# Patient Record
Sex: Female | Born: 1960 | Race: White | Hispanic: No | Marital: Single
Health system: Midwestern US, Community
[De-identification: ages and names within clinical notes are randomized; demographics above are authoritative.]

## PROBLEM LIST (undated history)

## (undated) DIAGNOSIS — Z1231 Encounter for screening mammogram for malignant neoplasm of breast: Secondary | ICD-10-CM

## (undated) DIAGNOSIS — R920 Mammographic microcalcification found on diagnostic imaging of breast: Secondary | ICD-10-CM

## (undated) DIAGNOSIS — Z01811 Encounter for preprocedural respiratory examination: Secondary | ICD-10-CM

---

## 2014-07-21 LAB — COMPREHENSIVE METABOLIC PANEL
ALT: 15 U/L (ref 0–32)
AST: 20 U/L (ref 0–31)
Albumin: 4.6 g/dL (ref 3.5–5.2)
Alkaline Phosphatase: 77 U/L (ref 35–104)
Anion Gap: 14 mmol/L (ref 7–16)
BUN: 8 mg/dL (ref 6–20)
CO2: 27 mmol/L (ref 22–29)
Calcium: 9.7 mg/dL (ref 8.6–10.2)
Chloride: 103 mmol/L (ref 98–107)
Creatinine: 0.6 mg/dL (ref 0.5–1.0)
GFR African American: >60
GFR Non-African American: >60 mL/min/{1.73_m2} (ref >=60)
Glucose: 101 mg/dL (ref 74–109)
Potassium: 4.7 mmol/L (ref 3.5–5.0)
Sodium: 144 mmol/L (ref 132–146)
Total Bilirubin: 0.4 mg/dL (ref 0.0–1.2)
Total Protein: 7.4 g/dL (ref 6.4–8.3)

## 2014-07-21 LAB — CBC
Hematocrit: 42.8 % (ref 34.0–48.0)
Hemoglobin: 13.8 g/dL (ref 11.5–15.5)
MCH: 30.6 pg (ref 26.0–35.0)
MCHC: 32.3 % (ref 32.0–34.5)
MCV: 94.8 fL (ref 80.0–99.9)
MPV: 8.7 fL (ref 7.0–12.0)
Platelets: 261 E9/L (ref 130–450)
RBC: 4.52 E12/L (ref 3.50–5.50)
RDW: 13.6 fL (ref 11.5–15.0)
WBC: 9.9 E9/L (ref 4.5–11.5)

## 2014-07-21 LAB — LIPID PANEL
Cholesterol, Total: 178 mg/dL (ref 0–199)
HDL: 67 mg/dL (ref >40)
LDL Calculated: 94 mg/dL (ref 0–99)
Triglycerides: 84 mg/dL (ref 0–149)
VLDL Cholesterol Calculated: 17 mg/dL

## 2014-07-21 LAB — C-REACTIVE PROTEIN: CRP: 0.2 mg/dL (ref 0.0–0.4)

## 2014-07-21 LAB — TSH: TSH: 1.19 u[IU]/mL (ref 0.270–4.200)

## 2014-07-21 LAB — RHEUMATOID FACTOR: Rheumatoid Factor: <10 IU/mL (ref 0–13)

## 2014-07-21 LAB — ANA: ANA: NEGATIVE

## 2014-07-21 LAB — SEDIMENTATION RATE: Sed Rate: 3 mm/Hr (ref 0–20)

## 2015-02-18 ENCOUNTER — Encounter

## 2015-02-18 ENCOUNTER — Inpatient Hospital Stay: Primary: Family Medicine

## 2015-02-18 ENCOUNTER — Ambulatory Visit: Admit: 2015-02-18 | Primary: Family Medicine

## 2015-02-18 DIAGNOSIS — R920 Mammographic microcalcification found on diagnostic imaging of breast: Secondary | ICD-10-CM

## 2015-02-18 LAB — PROTIME-INR
INR: 1.1
Protime: 11.3 s (ref 9.3–12.4)

## 2019-11-30 ENCOUNTER — Ambulatory Visit
Admit: 2019-11-30 | Discharge: 2019-11-30 | Payer: BLUE CROSS/BLUE SHIELD | Attending: Family Medicine | Primary: Family Medicine

## 2019-11-30 DIAGNOSIS — K629 Disease of anus and rectum, unspecified: Secondary | ICD-10-CM

## 2019-11-30 MED ORDER — PREDNISONE 5 MG (21) PO TBPK
5 MG (21) | ORAL | 0 refills | Status: DC
Start: 2019-11-30 — End: 2020-02-15

## 2019-12-24 NOTE — Telephone Encounter (Signed)
11/30/2019  Visit date not found    Tresa Endo from [employer] in [place] called for prior auth on CT AB/Pelvis.      This was never ordered by our office.  Rave was informed that we would not be obtaining the auth.  Nor are we going to order the imaging.

## 2020-01-08 ENCOUNTER — Encounter

## 2020-01-11 ENCOUNTER — Ambulatory Visit: Admit: 2020-01-11 | Discharge: 2020-01-11 | Payer: BLUE CROSS/BLUE SHIELD | Primary: Family Medicine

## 2020-01-11 ENCOUNTER — Encounter
Admit: 2020-01-11 | Discharge: 2020-01-11 | Payer: BLUE CROSS/BLUE SHIELD | Attending: Family Medicine | Primary: Family Medicine

## 2020-01-11 DIAGNOSIS — Z01811 Encounter for preprocedural respiratory examination: Secondary | ICD-10-CM

## 2020-01-11 DIAGNOSIS — Z01818 Encounter for other preprocedural examination: Secondary | ICD-10-CM

## 2020-01-11 NOTE — Telephone Encounter (Signed)
Attempted to call  No answer  Left detailed message

## 2020-01-11 NOTE — Telephone Encounter (Signed)
[facility] called back, advised of results.   Sending letter of clearance with ekg and cxr to dr. Olen Cordial

## 2020-01-11 NOTE — Telephone Encounter (Signed)
Lisa Nunez came in today for her pre op EKG  Can you please review? She wants to know the results of it today.  Is she cleared for surgery based off of the EKG?

## 2020-01-18 ENCOUNTER — Encounter: Attending: Family Medicine | Primary: Family Medicine

## 2020-02-15 ENCOUNTER — Ambulatory Visit: Admit: 2020-02-15 | Discharge: 2020-02-15 | Payer: BLUE CROSS/BLUE SHIELD | Attending: Family | Primary: Family Medicine

## 2020-02-15 DIAGNOSIS — R42 Dizziness and giddiness: Secondary | ICD-10-CM

## 2020-02-15 MED ORDER — MECLIZINE HCL 25 MG PO TABS
25 MG | ORAL_TABLET | Freq: Three times a day (TID) | ORAL | 0 refills | Status: AC | PRN
Start: 2020-02-15 — End: 2020-02-25

## 2020-02-15 MED ORDER — NEOMYCIN-POLYMYXIN-HC 3.5-10000-1 OT SOLN
Freq: Three times a day (TID) | OTIC | 0 refills | Status: AC
Start: 2020-02-15 — End: 2020-02-25

## 2020-02-15 NOTE — Progress Notes (Signed)
Lisa Nunez (DOB:  04-22-1961) is a 59 y.o. female,Established patient, here for evaluation of the following chief complaint(s):  Otalgia (rt ear - hx of ear infections and impaction ) and Dizziness         ASSESSMENT/PLAN:  1. Vertigo  -     meclizine (ANTIVERT) 25 MG tablet; Take 1 tablet by mouth 3 times daily as needed for Dizziness, Disp-30 tablet, R-0Normal  -epley manuever  2. Acute otitis externa of right ear, unspecified type  -     neomycin-polymyxin-hydrocortisone (CORTISPORIN) 3.5-10000-1 otic solution; Place 4 drops into the right ear 3 times daily for 10 days Instill into right Ear, Disp-10 mL, R-0Normal    Contact office if symptoms do not improve as expected or worsen.    Return if symptoms worsen or fail to improve.         Subjective   SUBJECTIVE/OBJECTIVE:  Complains of history of right ear infections and impactions.  Also history of vertigo. States ear is painful and she has been dizziness for past 2 days.  Taking mucinex and zyrtec      Review of Systems   Constitutional: Negative for chills, diaphoresis and fever.   HENT: Positive for congestion and ear pain. Negative for sore throat.    Respiratory: Negative for cough, shortness of breath and wheezing.    Cardiovascular: Negative for chest pain and palpitations.   Gastrointestinal: Positive for nausea (with vertigo). Negative for constipation, diarrhea and vomiting.   Allergic/Immunologic: Positive for environmental allergies.   Neurological: Positive for dizziness. Negative for weakness and headaches.          Objective   BP 130/78    Pulse 112    Temp 96.8 ??F (36 ??C) (Temporal)    Resp 14    Ht 5\' 5"  (1.651 m)    Wt 104 lb (47.2 kg)    SpO2 96%    BMI 17.31 kg/m??    Physical Exam  Constitutional:       General: She is not in acute distress.     Appearance: Normal appearance. She is well-developed.   HENT:      Head: Normocephalic and atraumatic.      Right Ear: There is no impacted cerumen.      Left Ear: Tympanic membrane, ear canal and  external ear normal. There is no impacted cerumen.      Ears:      Comments: Right TM is dull, canal is erythematous  Eyes:      Extraocular Movements: Extraocular movements intact.      Pupils: Pupils are equal, round, and reactive to light.   Neck:      Thyroid: No thyromegaly.      Vascular: No carotid bruit.      Trachea: No tracheal deviation.   Cardiovascular:      Rate and Rhythm: Normal rate and regular rhythm.      Heart sounds: No murmur heard.     Pulmonary:      Effort: Pulmonary effort is normal.      Breath sounds: Normal breath sounds. No wheezing or rales.   Chest:      Chest wall: No tenderness.   Lymphadenopathy:      Cervical: No cervical adenopathy.   Skin:     General: Skin is warm and dry.   Neurological:      Mental Status: She is alert and oriented to person, place, and time.      Cranial Nerves: No cranial nerve  deficit.   Psychiatric:         Mood and Affect: Mood normal.         Behavior: Behavior normal.            MDM low      An electronic signature was used to authenticate this note.    --Orlie Dakin, APRN - CNP

## 2021-04-11 ENCOUNTER — Encounter

## 2021-04-11 ENCOUNTER — Inpatient Hospital Stay: Admit: 2021-04-11 | Payer: BLUE CROSS/BLUE SHIELD | Primary: Family Medicine

## 2021-04-11 DIAGNOSIS — Z1231 Encounter for screening mammogram for malignant neoplasm of breast: Secondary | ICD-10-CM

## 2022-03-15 IMAGING — MR MRI PELVIS W/WO CONTRAST
4 series · 11 of 48 positions shown · IV contrast (gadavist)
Comparison: MR exam of 05/02/2021, MR exam of the pelvis of 04/12/2020.

________________________________________________________________________________________________ 
******** ADDENDUM #1 ********/n 
Comparison is also made with exams dating back to CT exam of 01/11/2020. 
MRI PELVIS W/WO CONTRAST, MRI ABDOMEN W/WO CONTRAST, 03/15/2022 [DATE]: 
CLINICAL INDICATION: Malignant neoplasm of anus. Surgery in December 2019 per 
prior report.
TECHNIQUE: Multiplanar, multiecho position MR images of the pelvis and abdomen 
were performed without and with 5 mL of intravenous Gadavist were injected by 
hand. 2.5 mL of Gadavist were discarded.

[Series 801: t1_cor · coronal · 5.0mm · 0.62mm/px · 3 of 30 slices shown]
[im 4/30]
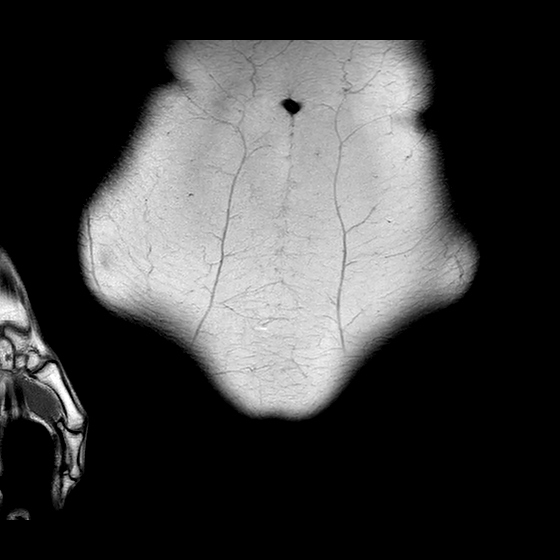
[im 15/30]
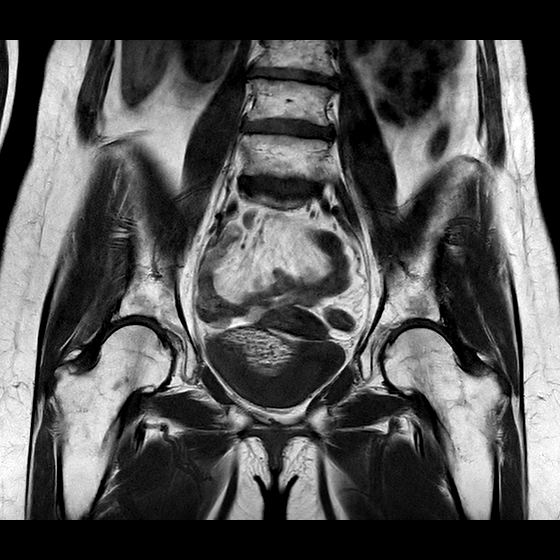
[im 26/30]
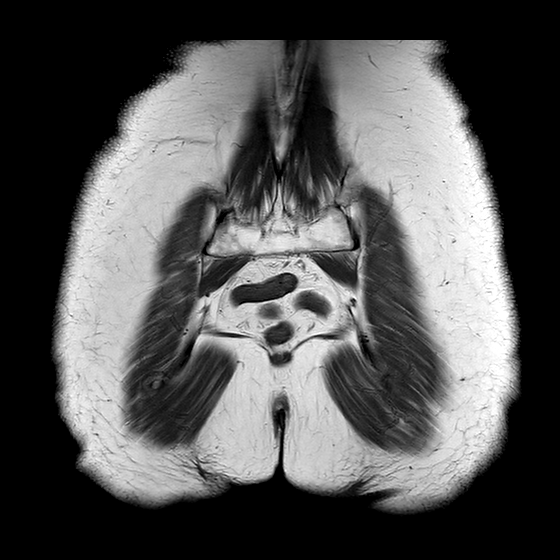

[Series 901: t1_(person_name) · axial · 6.0mm · 0.44mm/px · z∈[-196,-4]mm · 3 of 36 slices shown (1 of 2)]
[im 6/36]
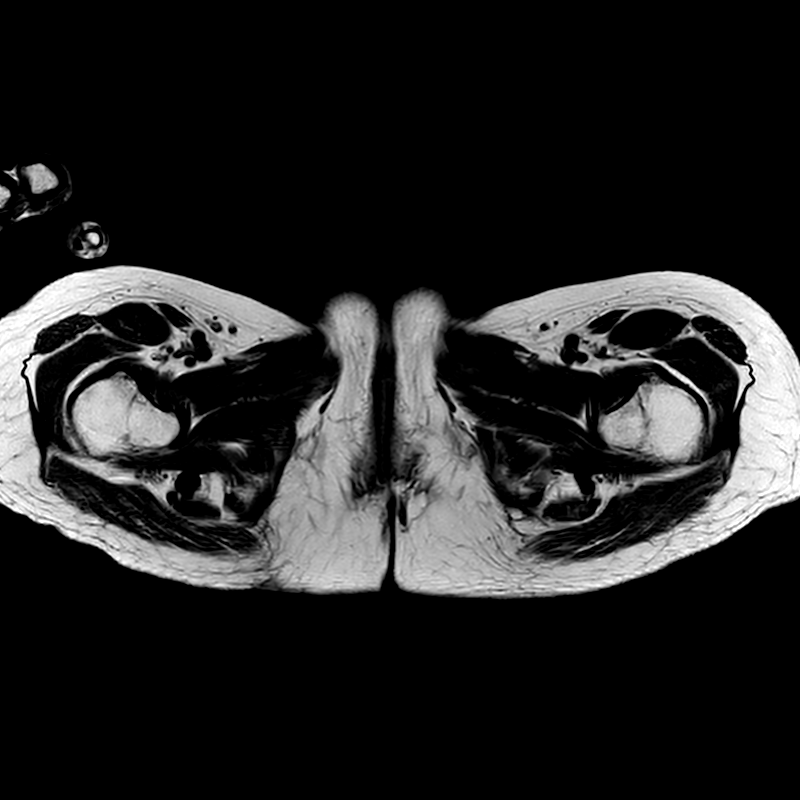
[im 18/36]
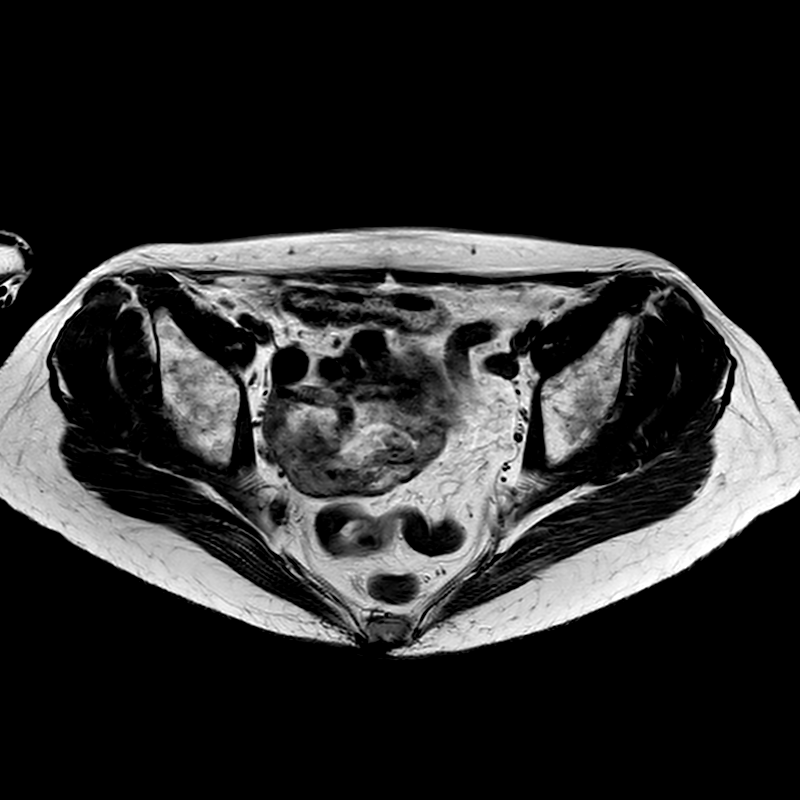
[im 30/36]
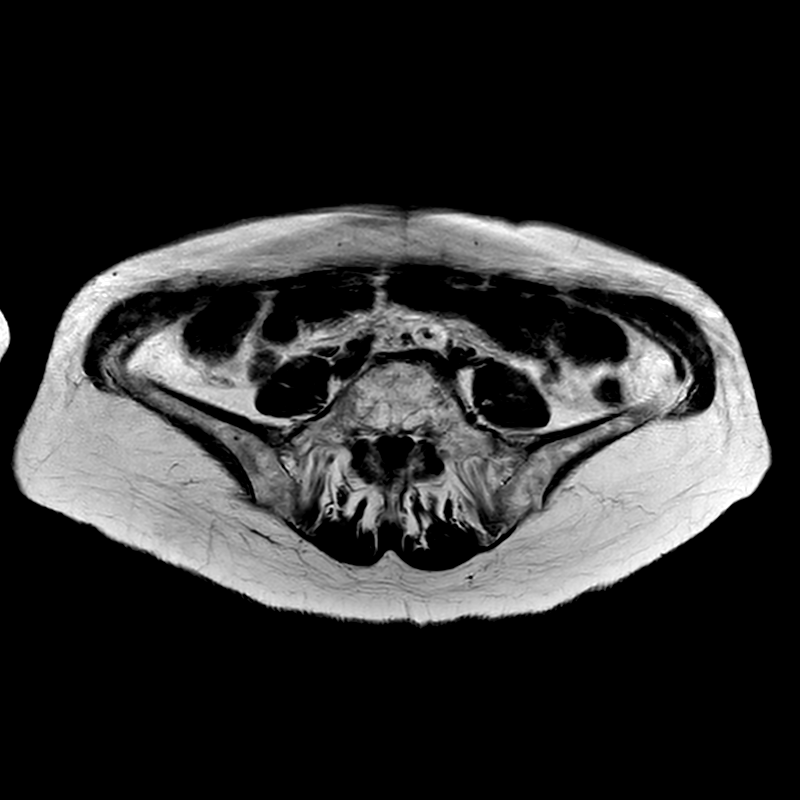

[Series 1001: (person_name)_(person_name)_(person_name) · axial · 6.0mm · 0.68mm/px · z∈[-196,-4]mm · 3 of 36 slices shown]
[im 6/36]
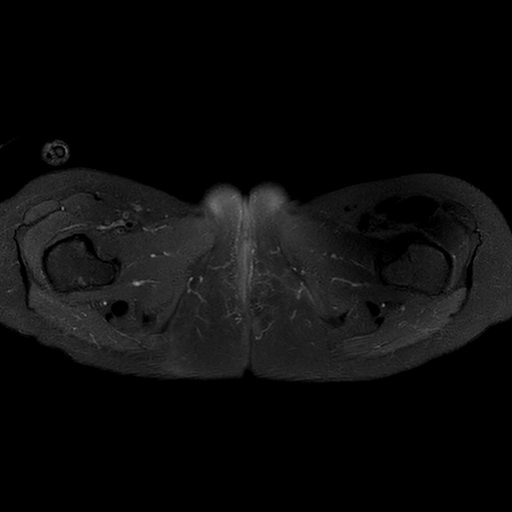
[im 18/36]
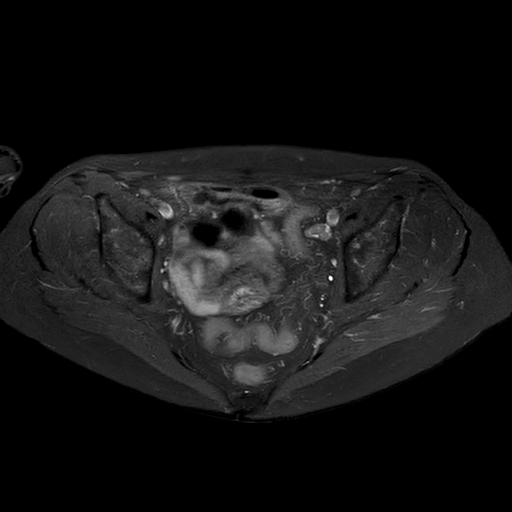
[im 30/36]
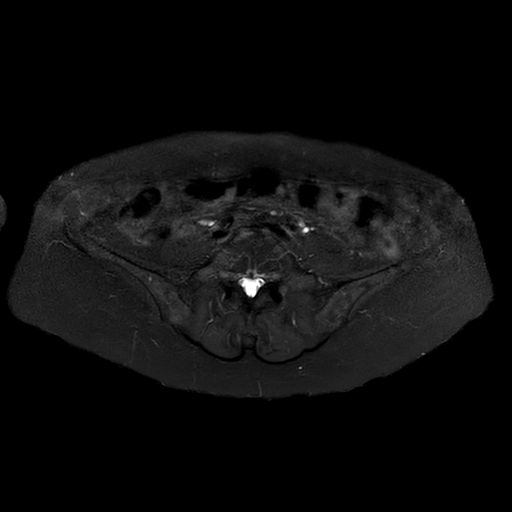

[Series 1301: t1_(person_name) · axial · 6.0mm · 0.44mm/px · z∈[-196,-100]mm · 2 of 36 slices shown (2 of 2)]
[im 6/36]
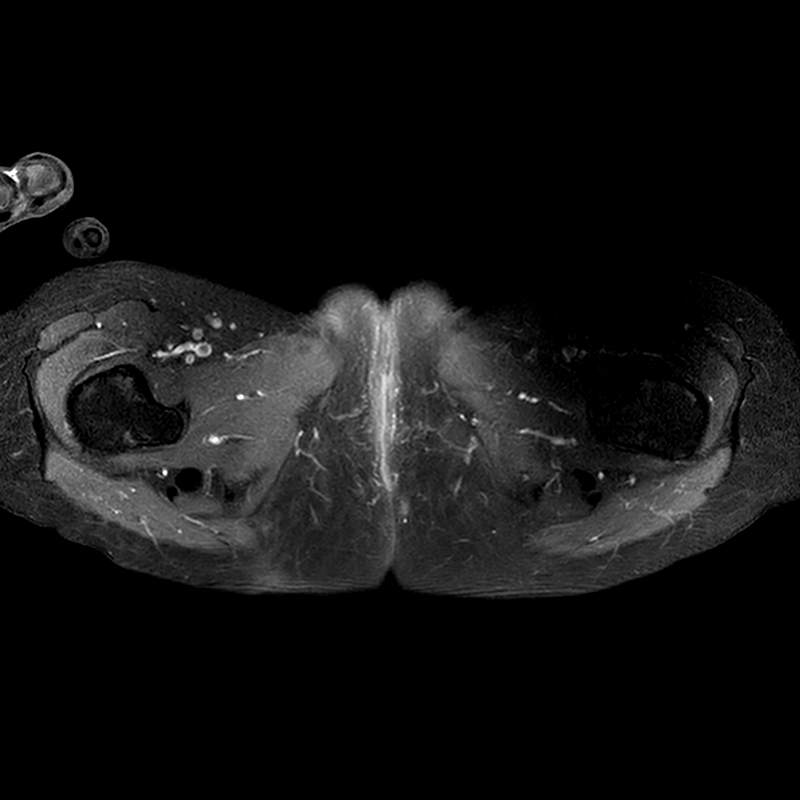
[im 18/36]
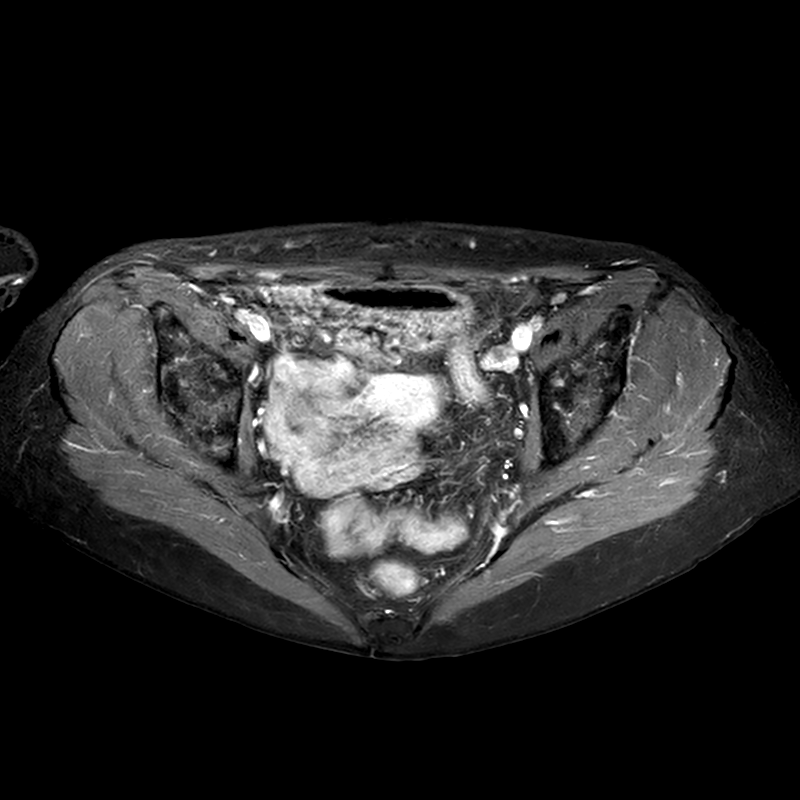

[11 of 48 positions shown; findings below may reference images not displayed]

FINDINGS: Abdomen: The liver is normal in size and signal intensity without abnormal 
enhancement. No evidence of hepatic metastases. Biliary tree is negative. 
Gallbladder is surgically absent. Spleen, pancreas, adrenal glands and kidneys 
are negative. Aorta and cava are negative. No lymphadenopathy. No abnormal 
restricted diffusion. No abnormal enhancement. 
Pelvis: No lymphadenopathy. Intrapelvic bowel loops are negative. Specifically, 
no mass or mucosal abnormality of the included bowel loops. Bladder is negative. 
Included neurovascular bundles are negative. Included musculoskeletal system is 
negative. No focal marrow replacing lesion or suggestion of osseous metastatic 
disease.
IMPRESSION: Normal MRI exam of the abdomen and pelvis. No evidence of recurrent mass or 
metastatic disease. 
Note is made that exam was performed as per prior available exams. Specifically, 
the exam was not ordered as a rectal cancer restaging MR exam.

## 2022-03-15 IMAGING — CT CT CHEST WITH CONTRAST
2 of 4 series · 15 of 36 positions shown, 18 images · IV contrast (APPLIED)
Comparison: 03/14/2021

________________________________________________________________________________________________ 
CT CHEST WITH CONTRAST, 03/15/2022 [DATE]: 
CLINICAL INDICATION: Malignant Neoplasm Of Anus, Unspecified 
A search for DICOM formatted images was conducted for prior CT imaging studies 
completed at a non-affiliated media free facility.
TECHNIQUE: The chest was scanned from base of neck through the lung bases with 
100 mL of  Isovue 300 MDV injected intravenously on a high resolution low dose 
CT scanner. 0 mL of Isovue 300 MDV were discarded. Routine MPR and MIP 3D 
renderings were performed with concurrent physician supervision. The patients 
eGFR was calculated to be 101.0 mL/min/1.73 m2 using the i-STAT device.

[Series 4: chest with 2.0 i31s 3 · axial · 0.67mm/px · z∈[-346,-28]mm · 12 of 175 slices shown, 15 images]
[im 8/175  mediastinal]
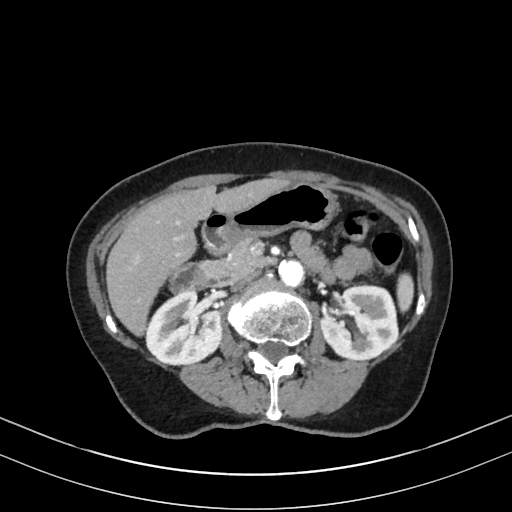
[im 8/175  lung]
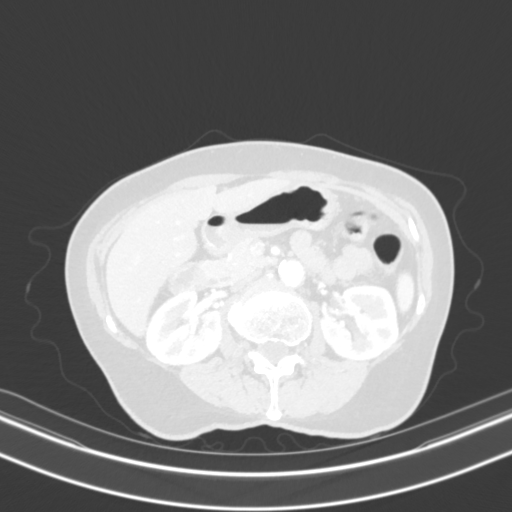
[im 24/175  lung]
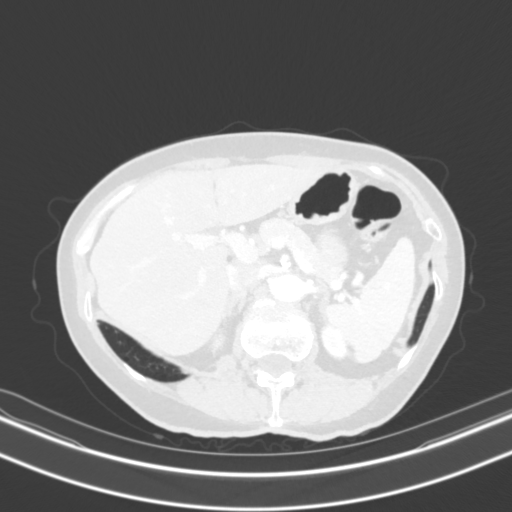
[im 40/175  lung]
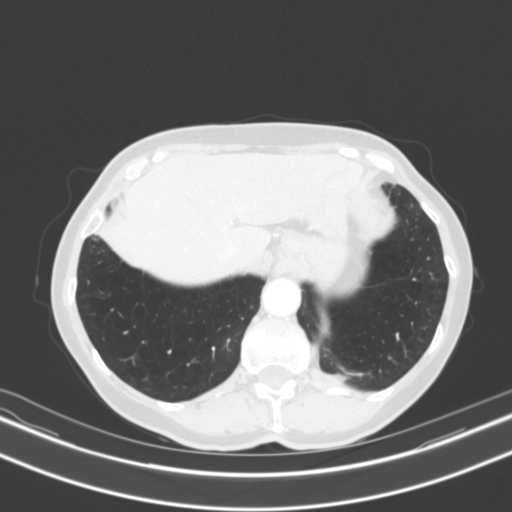
[im 56/175  lung]
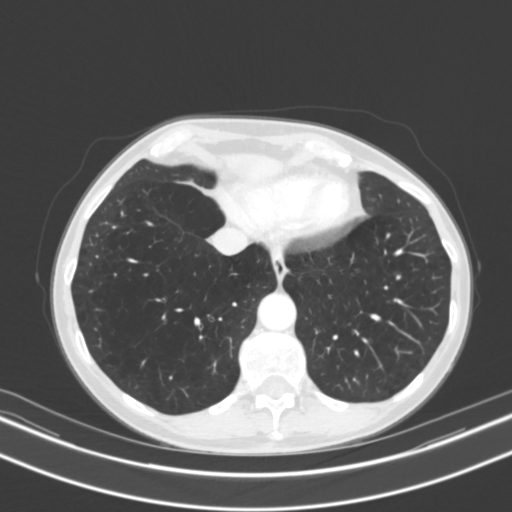
[im 64/175  mediastinal]
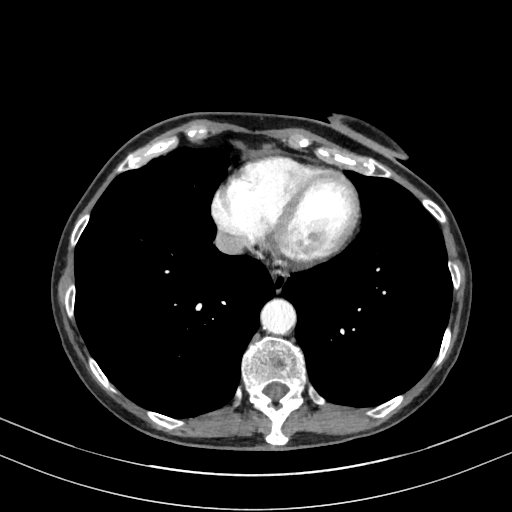
[im 64/175  lung]
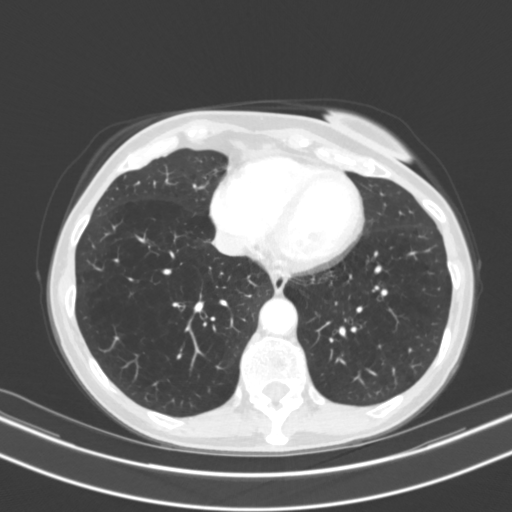
[im 80/175  lung]
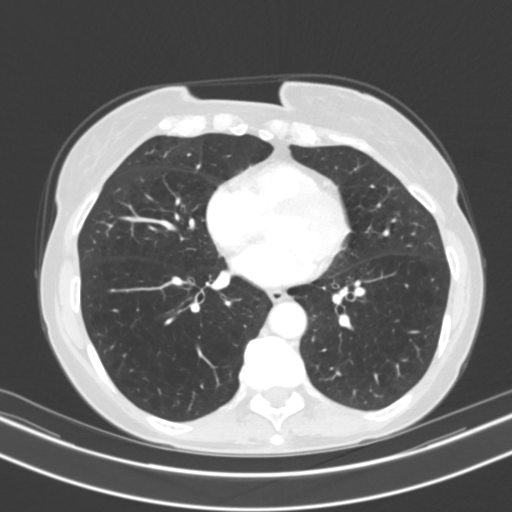
[im 95/175  lung]
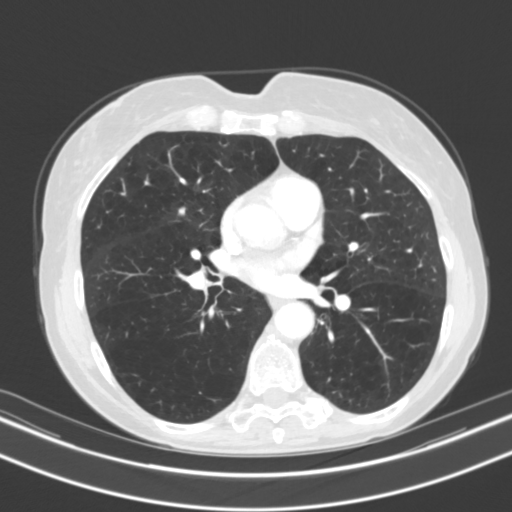
[im 111/175  lung]
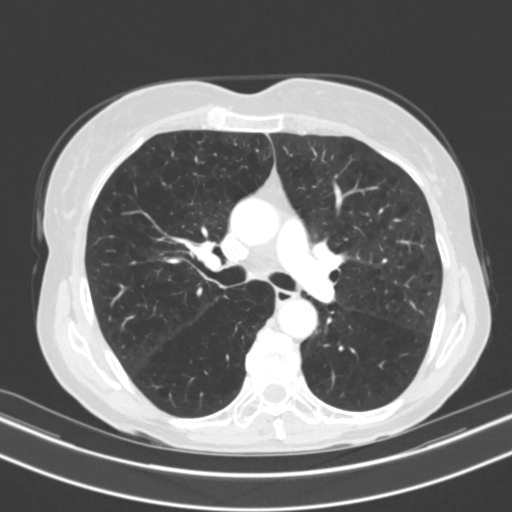
[im 119/175  mediastinal]
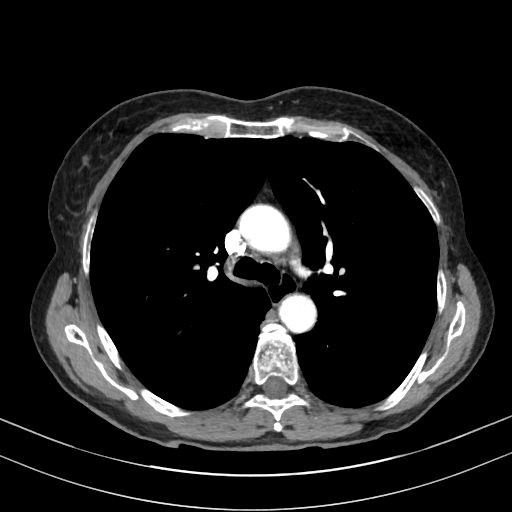
[im 119/175  lung]
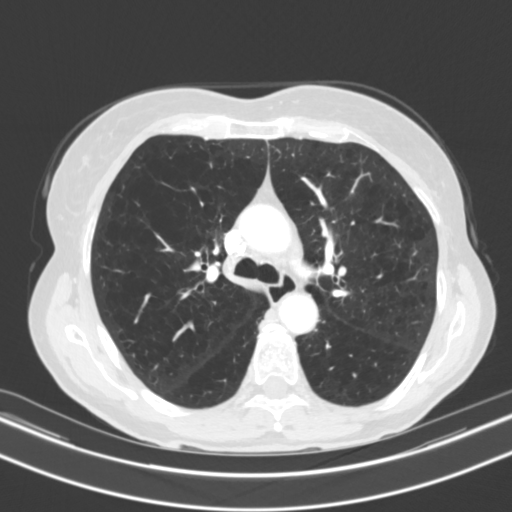
[im 135/175  lung]
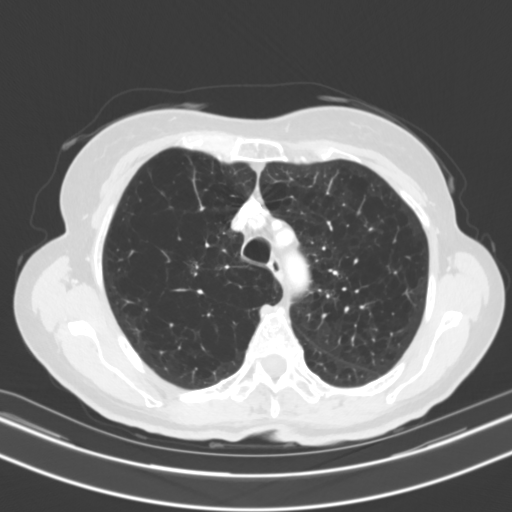
[im 151/175  lung]
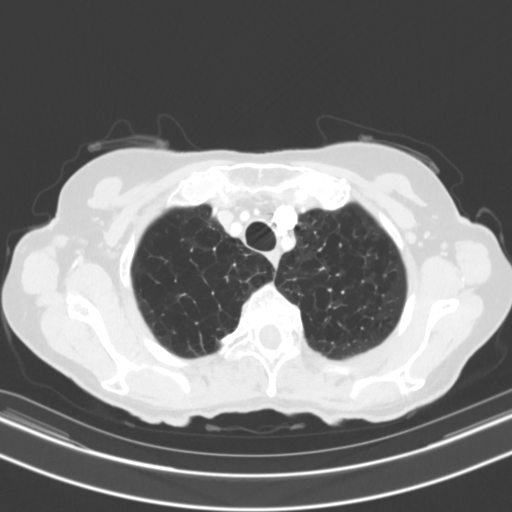
[im 167/175  lung]
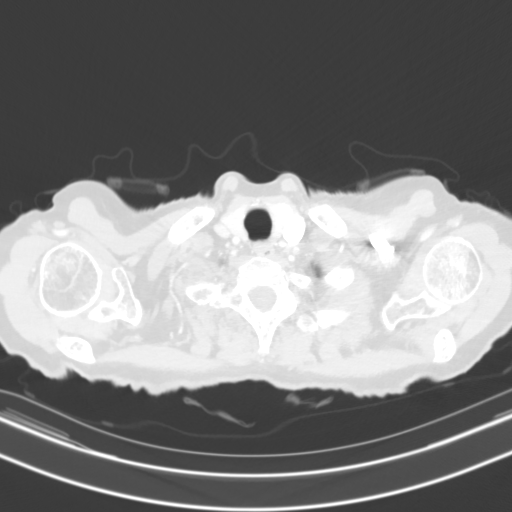

[Series 7: coronal · coronal · 0.67mm/px · 3 of 113 slices shown]
[im 23/113  lung]
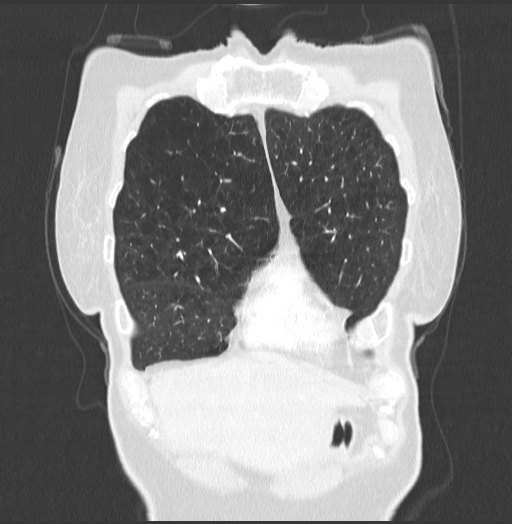
[im 45/113  lung]
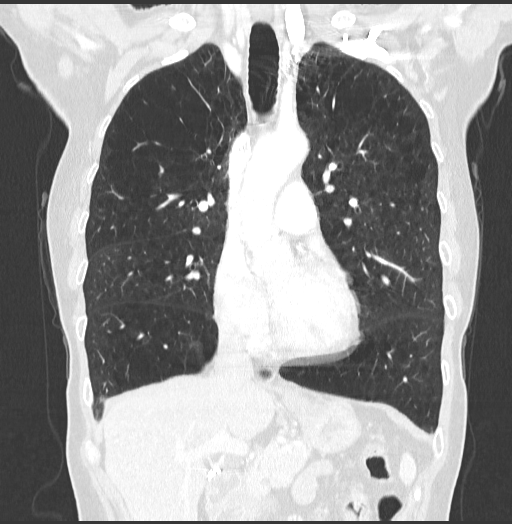
[im 68/113  lung]
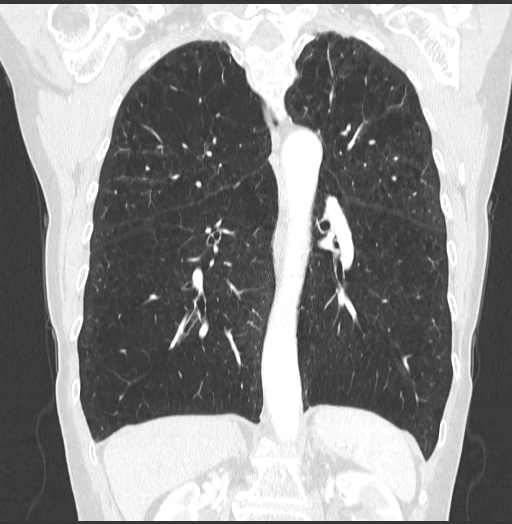

[15 of 36 positions shown; findings below may reference images not displayed]

FINDINGS: LUNGS AND PLEURA:  Severe emphysematous change. No suspicious pulmonary nodules. 
No acute consolidation or effusion.. No pleural effusion.  
MEDIASTINUM:  No adenopathy. Normal heart size. No pericardial effusion. Mild 
coronary artery calcifications noted. 
CHEST WALL/AXILLA: No mass or adenopathy.  
UPPER ABDOMEN: Small hiatal hernia. Fatty liver. Status post cholecystectomy. 
MUSCULOSKELETAL: No acute abnormality. Degenerative change. Mild wedging T8 
vertebral body is stable compared previous exam
IMPRESSION: Severe emphysematous change. No acute consolidation or effusion. 
No suspicious pulmonary nodules. 
No mediastinal or hilar adenopathy. 
RADIATION DOSE REDUCTION: All CT scans are performed using radiation dose 
reduction techniques, when applicable.  Technical factors are evaluated and 
adjusted to ensure appropriate moderation of exposure.  Automated dose 
management technology is applied to adjust the radiation doses to minimize 
exposure while achieving diagnostic quality images.

## 2022-03-15 IMAGING — MR MRI ABDOMEN W/WO CONTRAST
16 of 19 series · 35 of 48 positions shown · IV contrast (gadavist)
Comparison: MR exam of 05/02/2021, MR exam of the pelvis of 04/12/2020.

________________________________________________________________________________________________ 
******** ADDENDUM #1 ********/n 
Comparison is also made with exams dating back to CT exam of 01/11/2020. 
MRI PELVIS W/WO CONTRAST, MRI ABDOMEN W/WO CONTRAST, 03/15/2022 [DATE]: 
CLINICAL INDICATION: Malignant neoplasm of anus. Surgery in December 2019 per 
prior report.
TECHNIQUE: Multiplanar, multiecho position MR images of the pelvis and abdomen 
were performed without and with 5 mL of intravenous Gadavist were injected by 
hand. 2.5 mL of Gadavist were discarded.

[Series 101: survey-head 1st · axial · 15.0mm · 1.76mm/px · 1 of 15 slices shown]
[im 1/15]
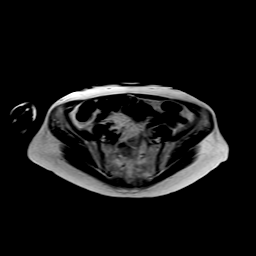

[Series 201: T2 · coronal · 5.0mm · 0.65mm/px · 1 of 32 slices shown]
[im 1/32]
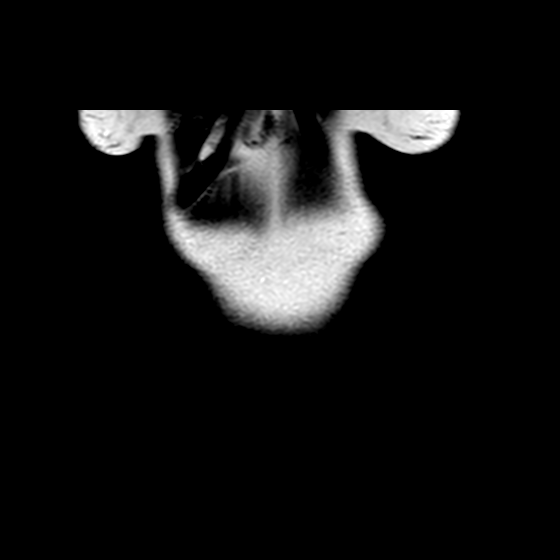

[Series 401: t2_ax_mvxd_hr_rt · axial · 5.0mm · 0.74mm/px · 1 of 34 slices shown]
[im 1/34]
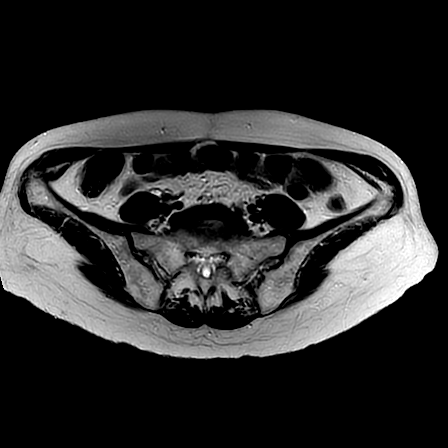

[Series 501: t2_spair mvxd_rt_fast · axial · 5.0mm · 0.86mm/px · 1 of 34 slices shown]
[im 1/34]
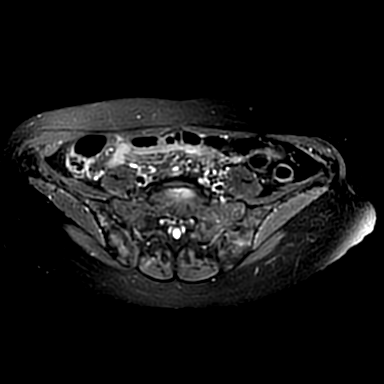

[Series 602: sout of phase · axial · 6.0mm · 0.98mm/px · 1 of 29 slices shown]
[im 1/29]
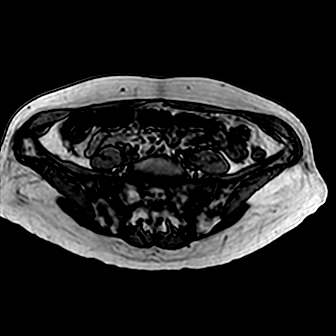

[Series 603: sin phase · axial · 6.0mm · 0.98mm/px · 1 of 29 slices shown]
[im 1/29]
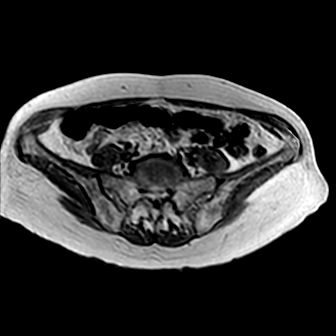

[Series 702: sbo · axial · 5.0mm · 1.56mm/px · 1 of 44 slices shown]
[im 1/44]
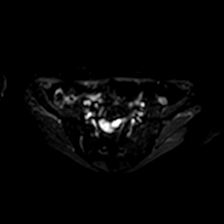

[Series 703: (id) · axial · 5.0mm · 1.56mm/px · 1 of 44 slices shown]
[im 1/44]
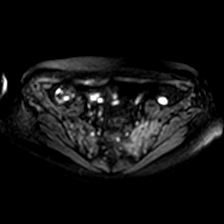

[Series 704: dadc 600 · axial · 5.0mm · 1.56mm/px · 1 of 44 slices shown]
[im 1/44]
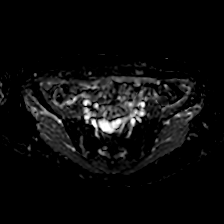

[Series 1102: DIXON · axial · 4.0mm · 0.91mm/px · z∈[-28,+174]mm · 3 of 102 slices shown (1 of 5)]
[im 1/102]
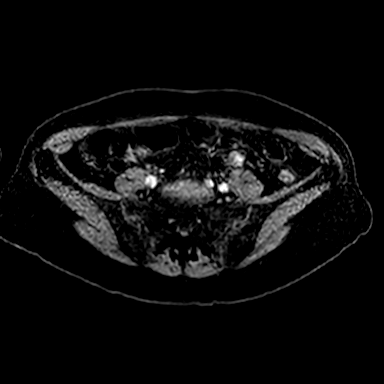
[im 51/102]
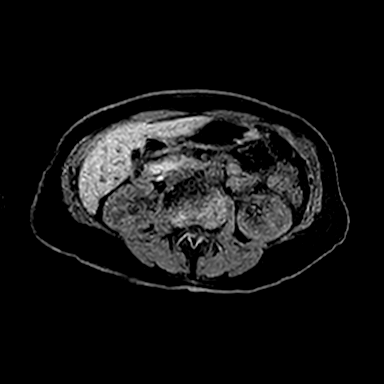
[im 102/102]
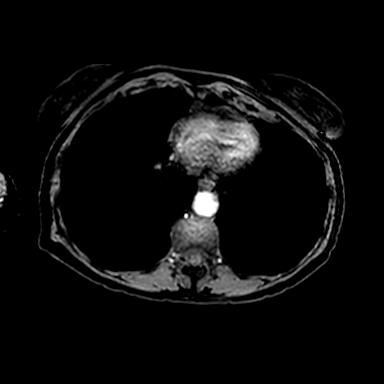

[Series 1103: DIXON · axial · 4.0mm · 0.91mm/px · z∈[-28,+174]mm · 4 of 102 slices shown (2 of 5)]
[im 1/102]
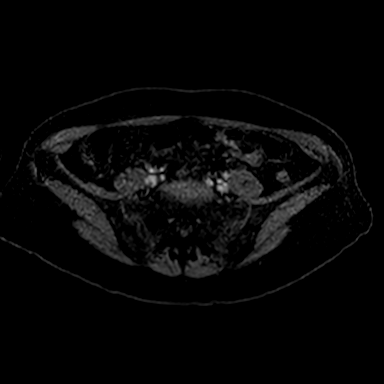
[im 34/102]
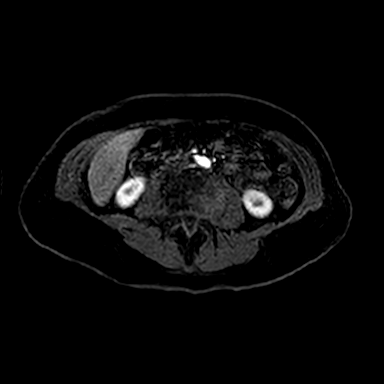
[im 68/102]
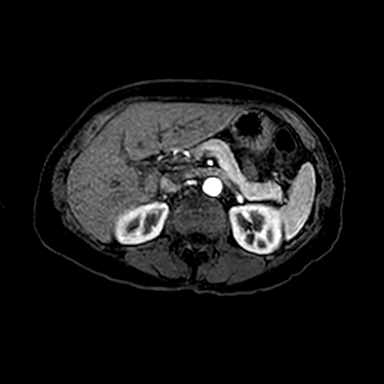
[im 102/102]
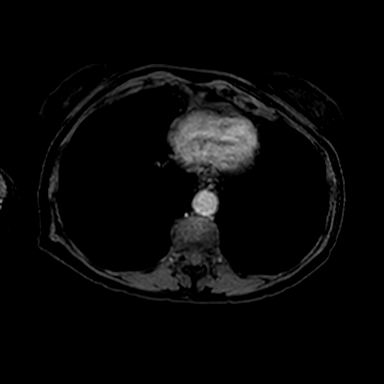

[Series 1104: DIXON · axial · 4.0mm · 0.91mm/px · z∈[-28,+174]mm · 4 of 102 slices shown (3 of 5)]
[im 1/102]
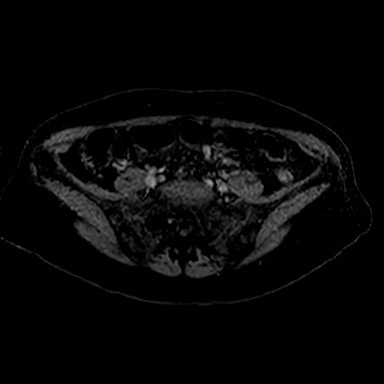
[im 34/102]
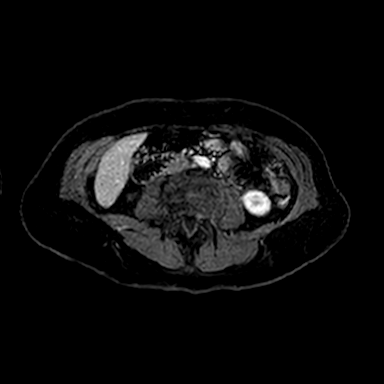
[im 68/102]
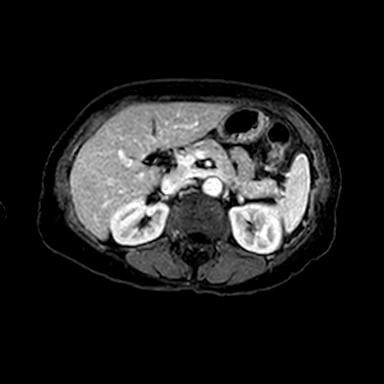
[im 102/102]
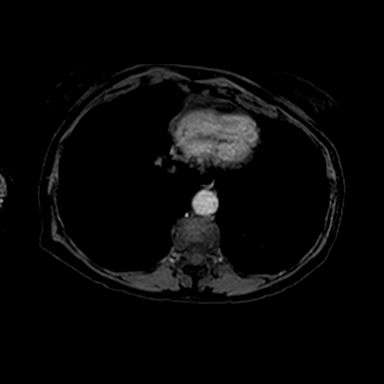

[Series 1105: DIXON · axial · 4.0mm · 0.91mm/px · z∈[-28,+174]mm · 4 of 102 slices shown (4 of 5)]
[im 1/102]
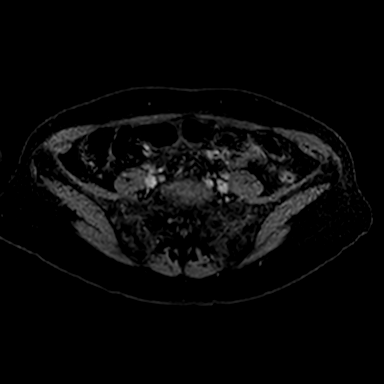
[im 34/102]
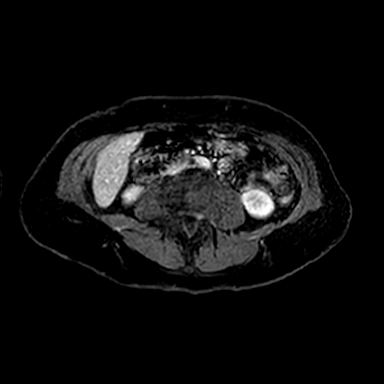
[im 68/102]
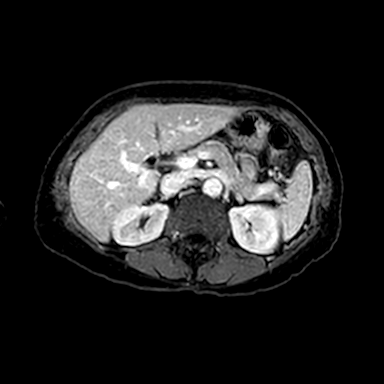
[im 102/102]
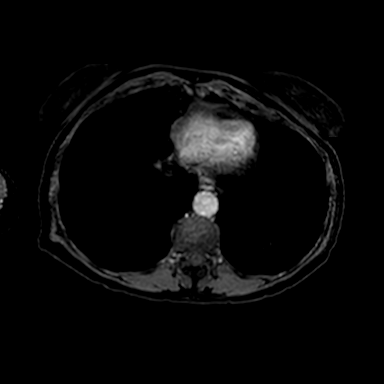

[Series 1106: DIXON · axial · 4.0mm · 0.91mm/px · z∈[-28,+174]mm · 4 of 102 slices shown (5 of 5)]
[im 1/102]
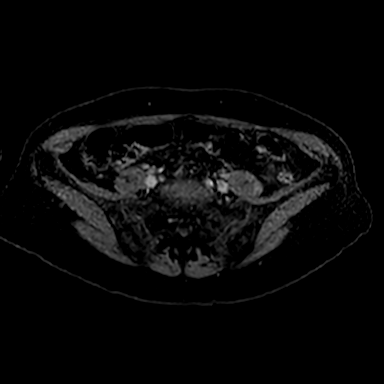
[im 34/102]
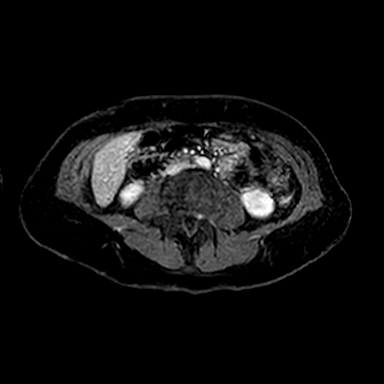
[im 68/102]
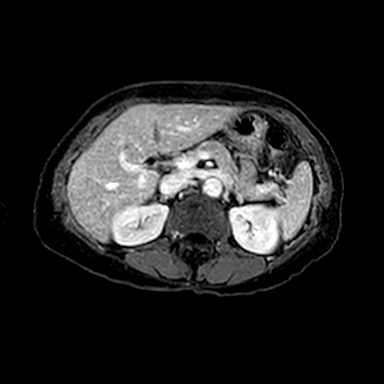
[im 102/102]
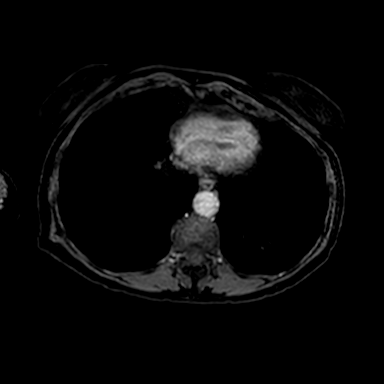

[Series 1107: DIXON post-contrast · axial · 4.0mm · 0.91mm/px · z∈[-28,+174]mm · 4 of 102 slices shown (1 of 2)]
[im 1/102]
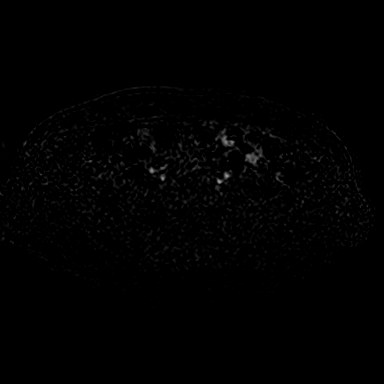
[im 34/102]
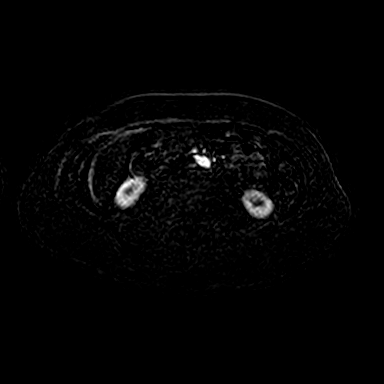
[im 68/102]
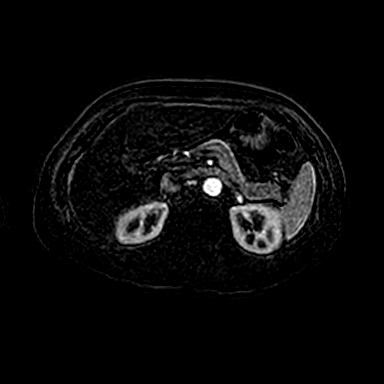
[im 102/102]
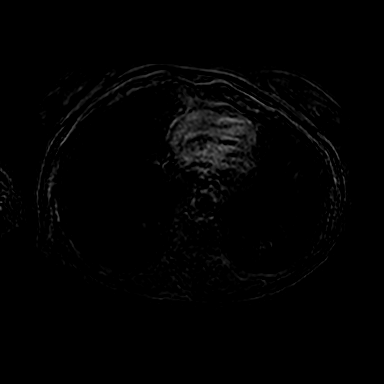

[Series 1108: DIXON post-contrast · axial · 4.0mm · 0.91mm/px · z∈[-28,+106]mm · 3 of 102 slices shown (2 of 2)]
[im 1/102]
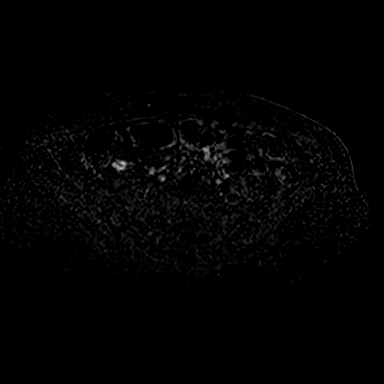
[im 34/102]
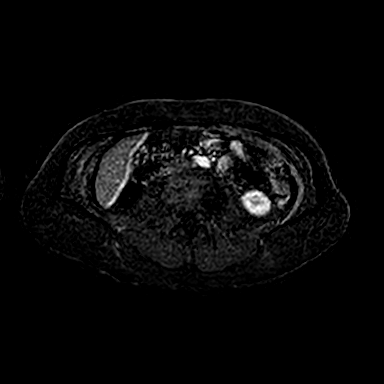
[im 68/102]
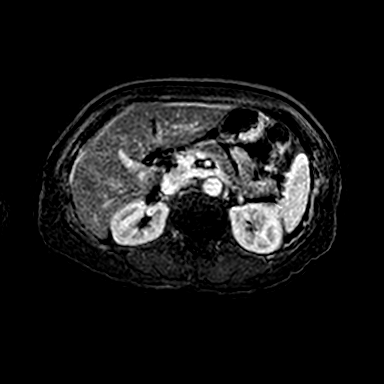

[35 of 48 positions shown; findings below may reference images not displayed]

FINDINGS: Abdomen: The liver is normal in size and signal intensity without abnormal 
enhancement. No evidence of hepatic metastases. Biliary tree is negative. 
Gallbladder is surgically absent. Spleen, pancreas, adrenal glands and kidneys 
are negative. Aorta and cava are negative. No lymphadenopathy. No abnormal 
restricted diffusion. No abnormal enhancement. 
Pelvis: No lymphadenopathy. Intrapelvic bowel loops are negative. Specifically, 
no mass or mucosal abnormality of the included bowel loops. Bladder is negative. 
Included neurovascular bundles are negative. Included musculoskeletal system is 
negative. No focal marrow replacing lesion or suggestion of osseous metastatic 
disease.
IMPRESSION: Normal MRI exam of the abdomen and pelvis. No evidence of recurrent mass or 
metastatic disease. 
Note is made that exam was performed as per prior available exams. Specifically, 
the exam was not ordered as a rectal cancer restaging MR exam.

## 2022-08-28 ENCOUNTER — Encounter

## 2022-09-18 IMAGING — MR MRI LUMBAR SPINE WITHOUT CONTRAST
7 of 8 series · 15 of 48 positions shown · IV contrast (gadolinium)
Comparison: No prior lumbar examination

________________________________________________________________________________________________ 
MRI LUMBAR SPINE WITHOUT CONTRAST, 09/18/2022 [DATE]: 
CLINICAL INDICATION: Lumbar radiculopathy, right buttock pain, right 
radiculopathy
TECHNIQUE: Sagittal T1, Sagittal T2, Sagittal STIR, Axial T1 and Axial T2 MR 
images of the lumbar spine were performed without intravenous gadolinium 
enhancement.

[Series 101: survey · axial · 10.0mm · 1.25mm/px · z∈[-33,+201]mm · 2 of 10 slices shown]
[im 1/10]
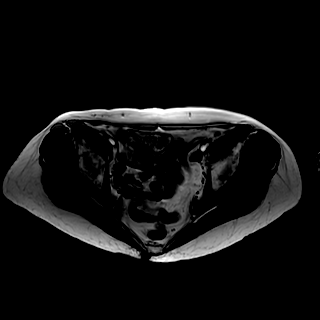
[im 10/10]
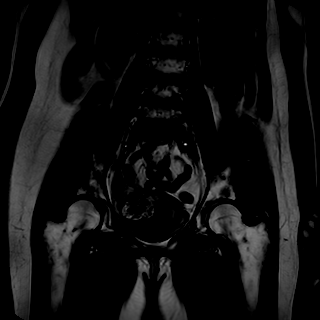

[Series 201: t2w_cor-surv · coronal · 6.0mm · 0.62mm/px · 2 of 14 slices shown]
[im 1/14]
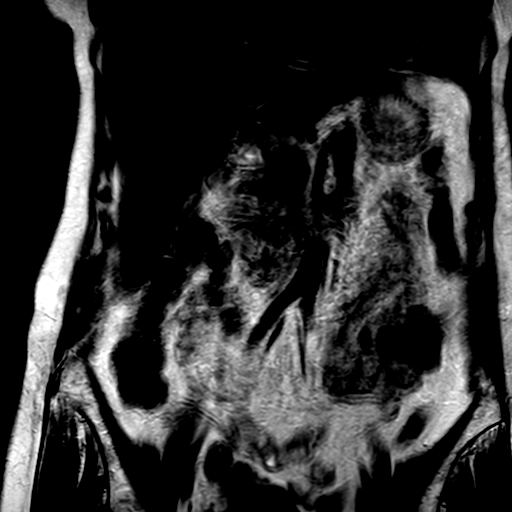
[im 14/14]
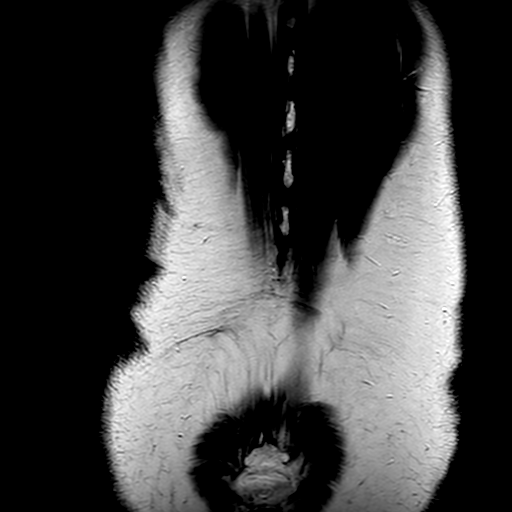

[Series 301: T1 · sagittal · 4.0mm · 0.42mm/px · 2 of 19 slices shown]
[im 1/19]
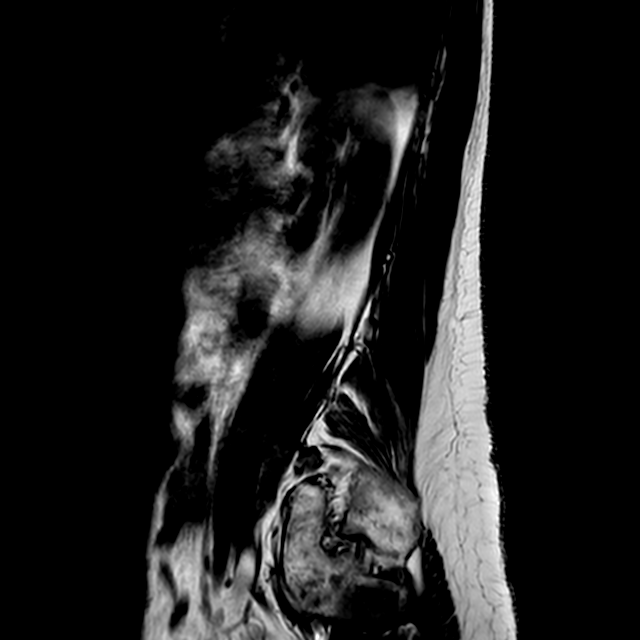
[im 19/19]
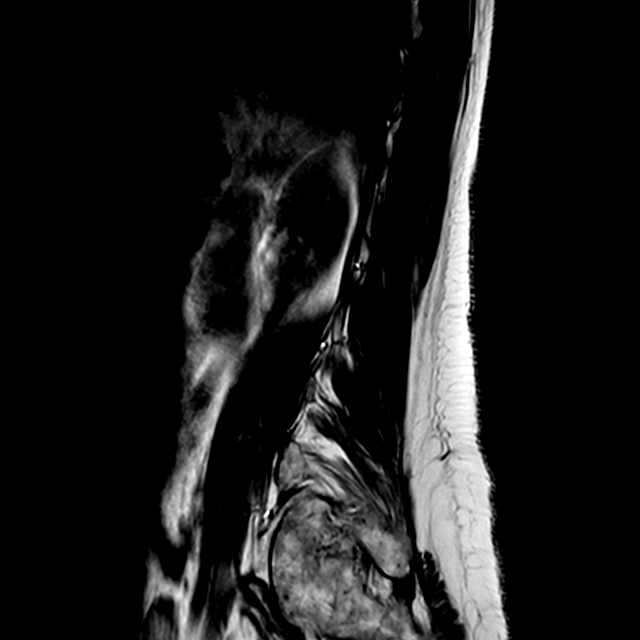

[Series 402: (id)_mdixon_tse · sagittal · 4.0mm · 0.48mm/px · 2 of 19 slices shown]
[im 1/19]
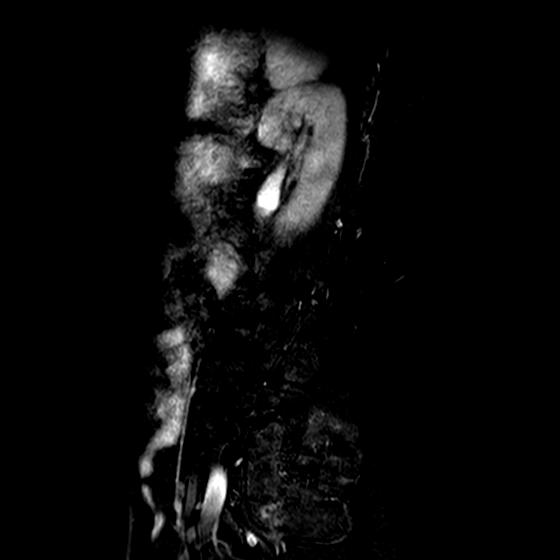
[im 19/19]
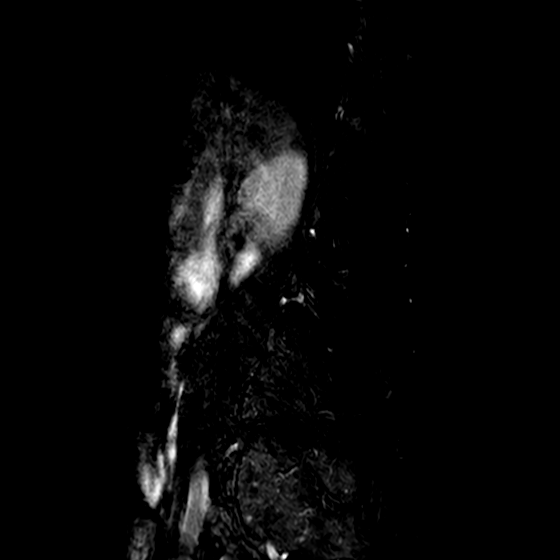

[Series 403: st2w_mdixon_tse · sagittal · 4.0mm · 0.48mm/px · 2 of 19 slices shown]
[im 1/19]
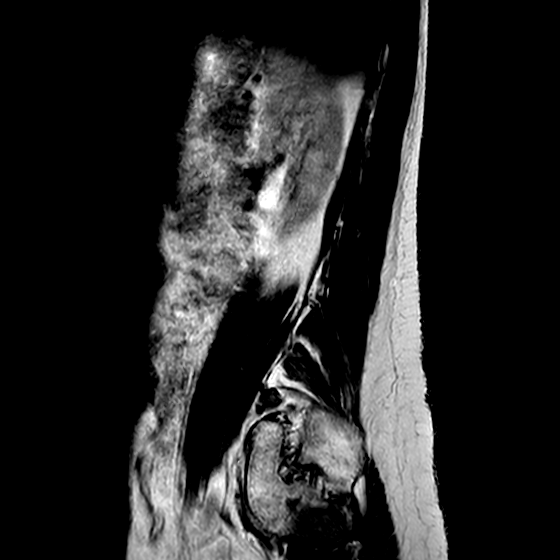
[im 19/19]
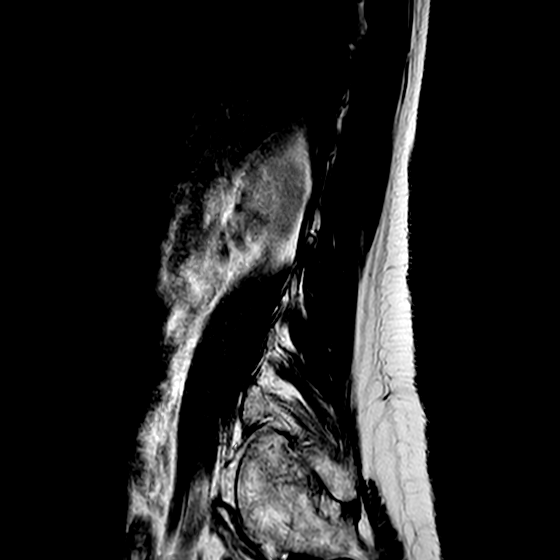

[Series 501: sag spineview ax · sagittal · 1.4mm · 0.31mm/px · 1 of 120 slices shown]
[im 1/120]
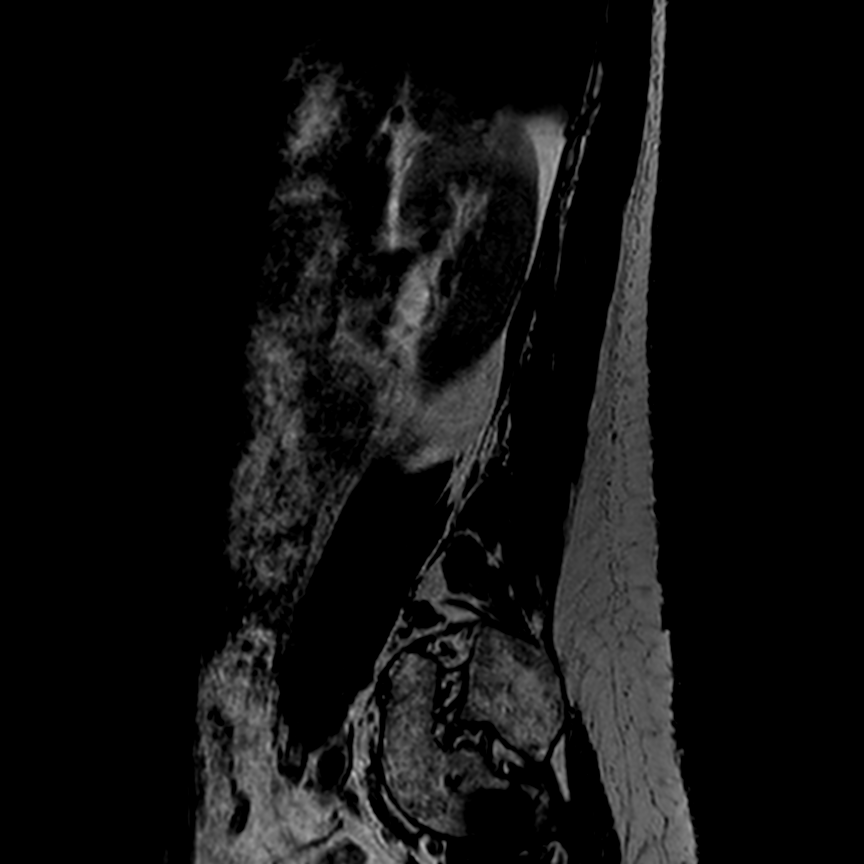

[Series 601: T2 · axial · 4.0mm · 0.30mm/px · z∈[+20,+220]mm · 4 of 30 slices shown]
[im 1/30]
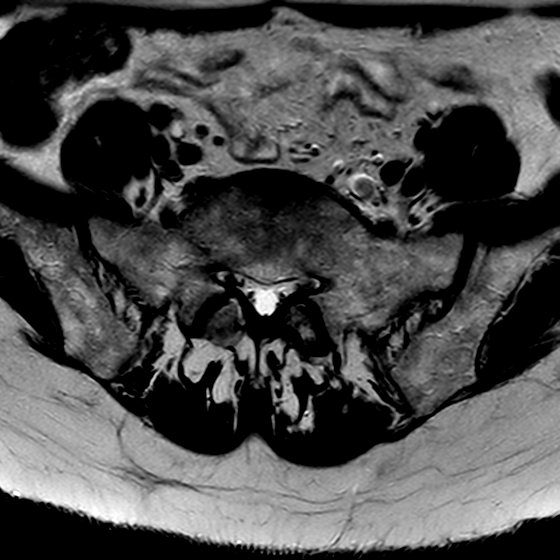
[im 10/30]
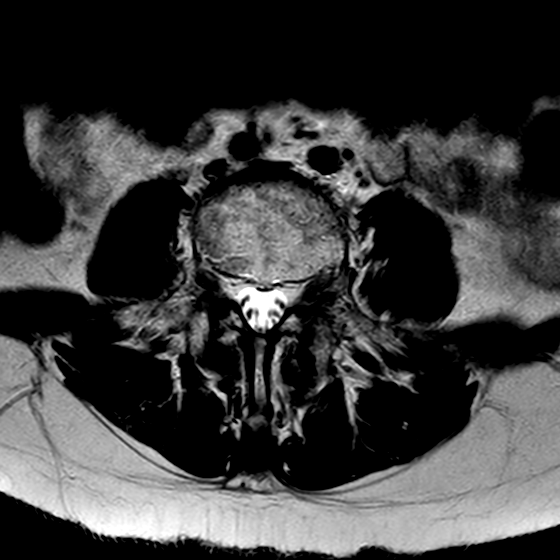
[im 20/30]
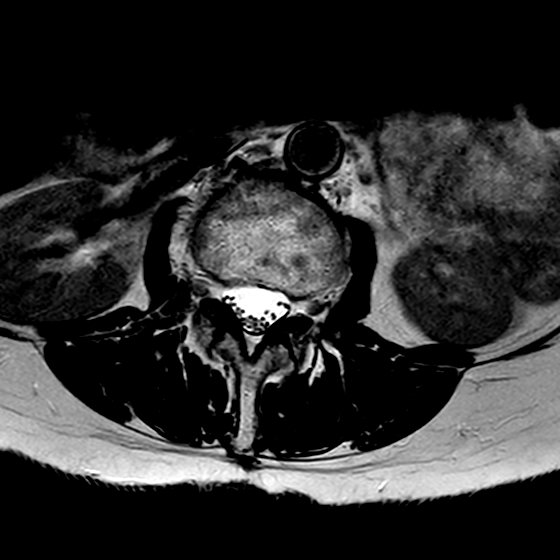
[im 30/30]
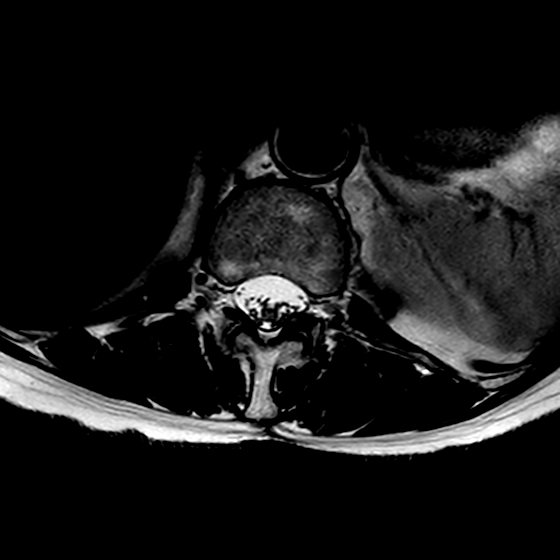

[15 of 48 positions shown; findings below may reference images not displayed]

FINDINGS: There is mild motion artifact limiting detail. 
Lumbar vertebral heights are intact. Modic type I change L5-S1. Conus terminates 
T12. There is inhomogeneity of marrow signal without specific evidence for 
malignancy. 
At L5-S1 the canal is open. There is narrowing of the upper S1 lateral recesses, 
worse on the left. Moderate facet change. Foramina are open. 
At L4-5 there is moderate to marked canal stenosis due to broad-based disc bulge 
with component of extrusion. Moderate to marked facet change with ligamentous 
thickening, impinging the L5 nerve roots bilaterally. Mild to moderate foraminal 
stenosis. 
At L3-4 there is mild canal stenosis. There is impingement of the upper L4 
lateral recesses bilaterally. Mild left foraminal stenosis. Moderate facet 
change. 
At L2-3 the canal is borderline narrow. Mild right foraminal stenosis. Moderate 
facet change. 
At L1-2 the canal and foramina are open.
IMPRESSION: Multilevel degenerative changes. Moderate to marked canal stenosis at L4-5 
impinging the L5 nerve roots bilaterally. 
At L3-4 there is impingement of the upper L4 lateral recesses bilaterally, with 
mild canal stenosis. 
Modic type I change at L5-S1. 
Mild lumbar levoscoliosis. 
Motion artifact limits detail.

## 2022-11-15 ENCOUNTER — Encounter: Payer: BLUE CROSS/BLUE SHIELD | Primary: Family Medicine

## 2023-05-13 NOTE — Telephone Encounter (Signed)
 Lisa Nunez  Pt want to know the results of her MRI

## 2023-08-26 IMAGING — CT CT CHEST WITHOUT CONTRAST
2 of 5 series · 14 of 36 positions shown, 17 images · non-contrast
Comparison: CT 03/15/2022.

________________________________________________________________________________________________ 
CT CHEST WITHOUT CONTRAST, 08/26/2023 [DATE]: 
CLINICAL INDICATION: Chronic Obstructive Pulmonary Disease, Unspecified. 
Emphysema, Unspecified. Severe Persistent Asthma, Uncomplicated 
A search for DICOM formatted images was conducted for prior CT imaging studies 
completed at a non-affiliated media free facility.
TECHNIQUE: The chest was scanned from base of neck through the lung bases 
without contrast on a high resolution low dose CT scanner. Routine MPR and MIP 
reconstruction images were performed.

[Series 3: lung · axial · 0.65mm/px · z∈[-358,-78]mm · 11 of 168 slices shown, 14 images]
[im 14/168  mediastinal]
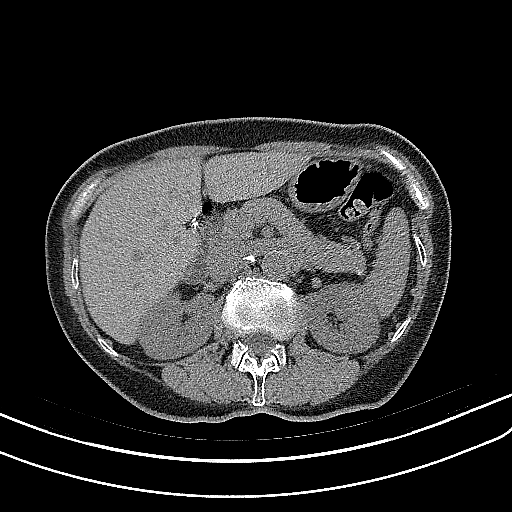
[im 14/168  lung]
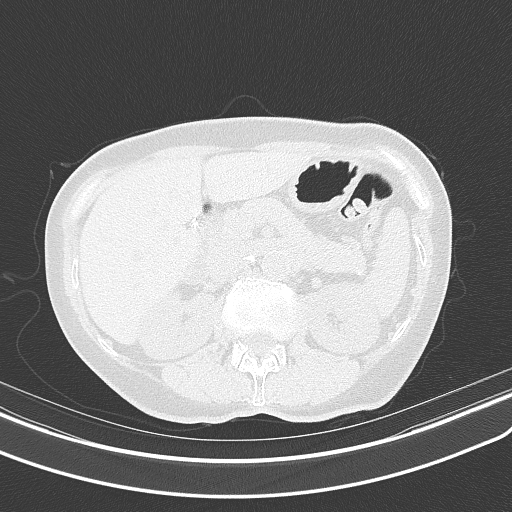
[im 28/168  lung]
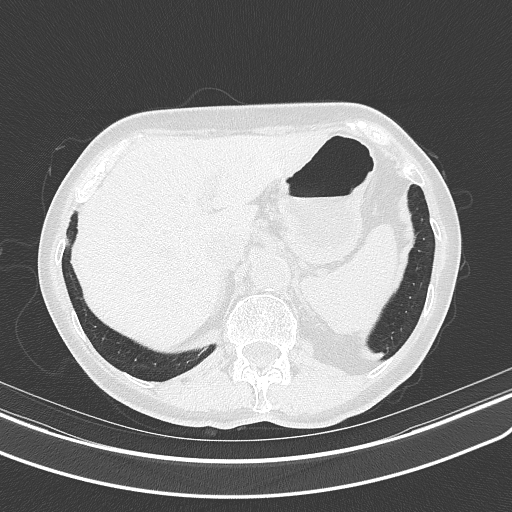
[im 42/168  lung]
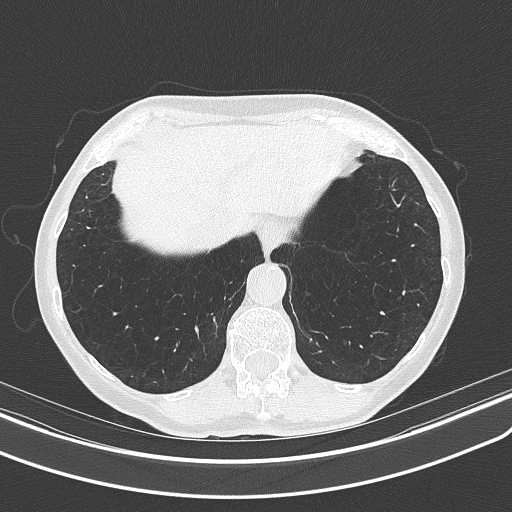
[im 56/168  lung]
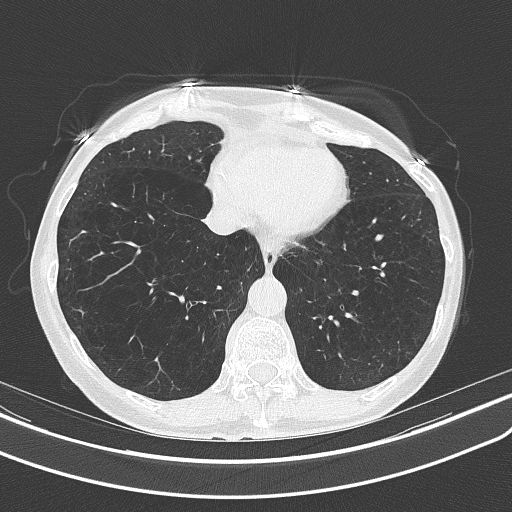
[im 70/168  mediastinal]
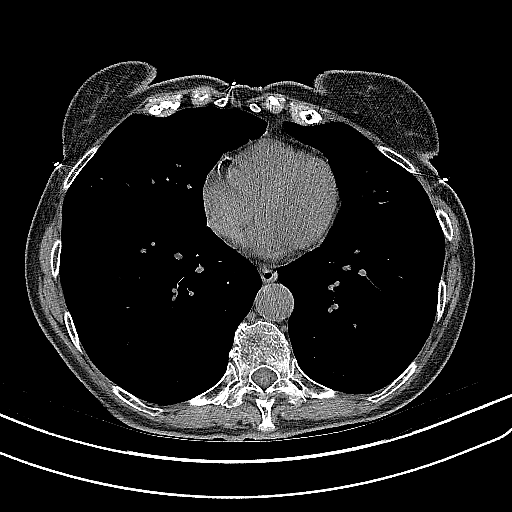
[im 70/168  lung]
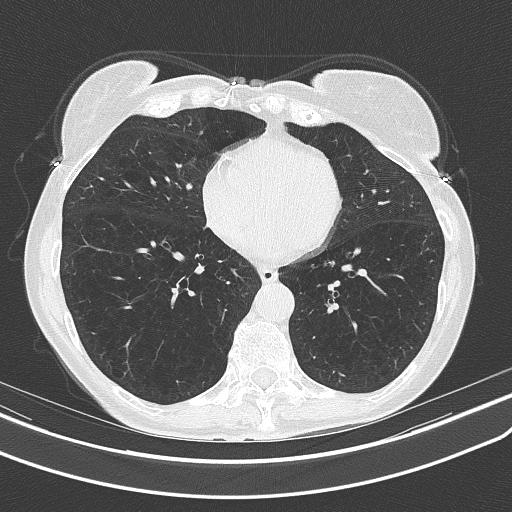
[im 84/168  lung]
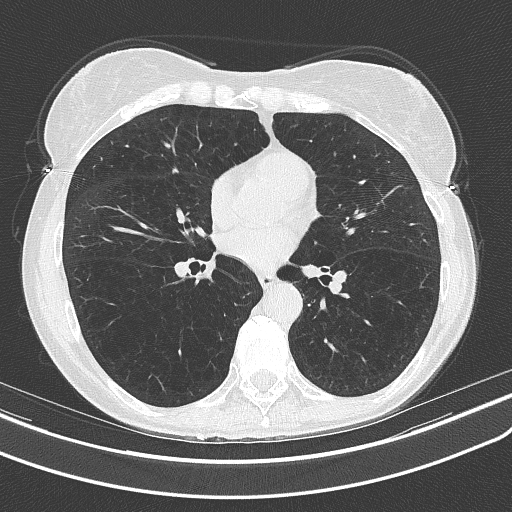
[im 98/168  lung]
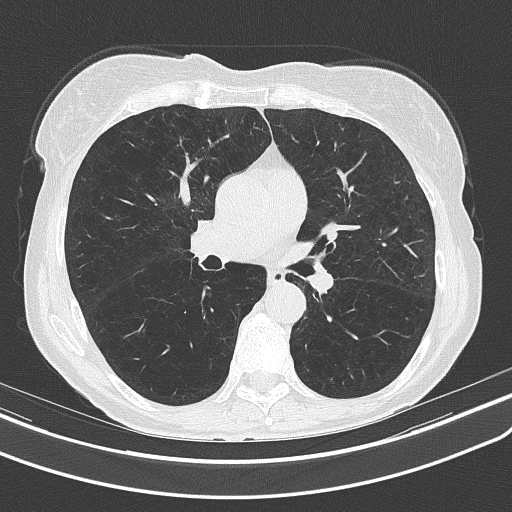
[im 112/168  lung]
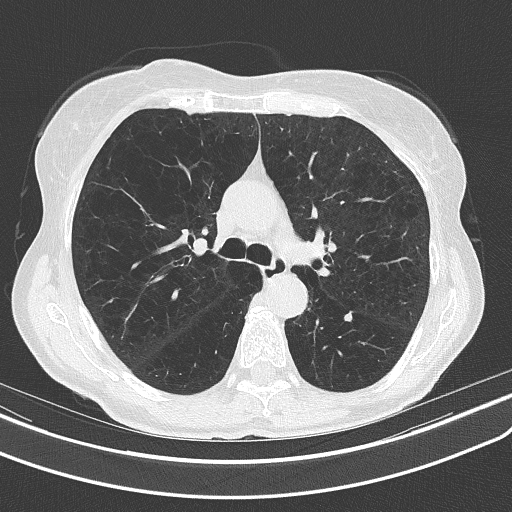
[im 126/168  mediastinal]
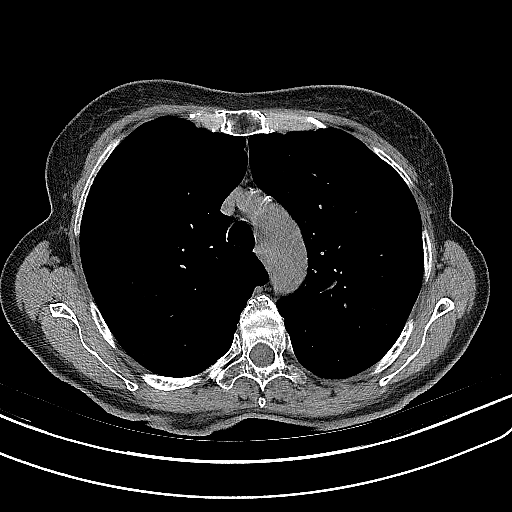
[im 126/168  lung]
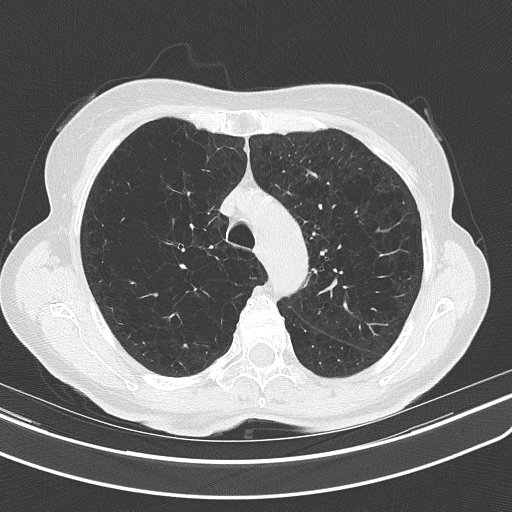
[im 140/168  lung]
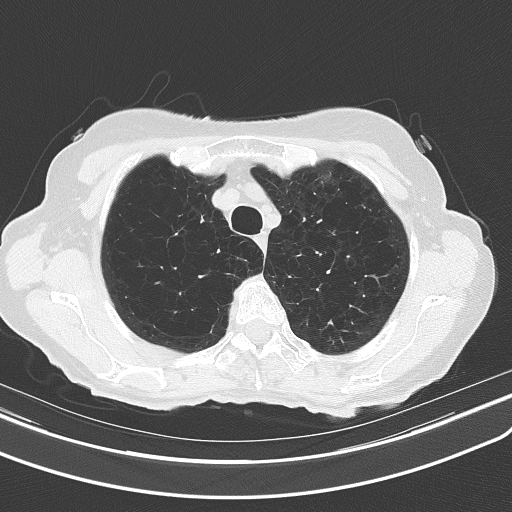
[im 154/168  lung]
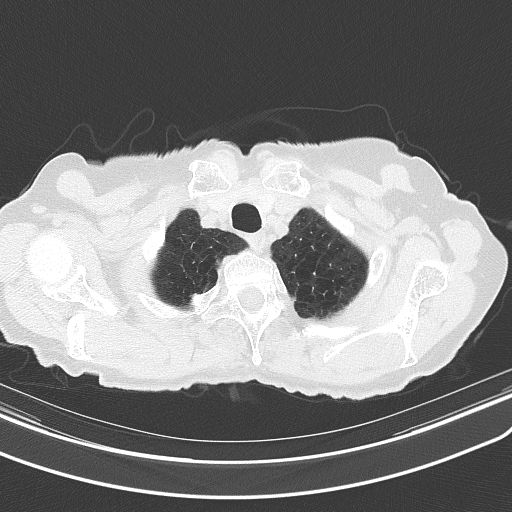

[Series 4: coronal · coronal · 0.65mm/px · 3 of 122 slices shown]
[im 25/122  lung]
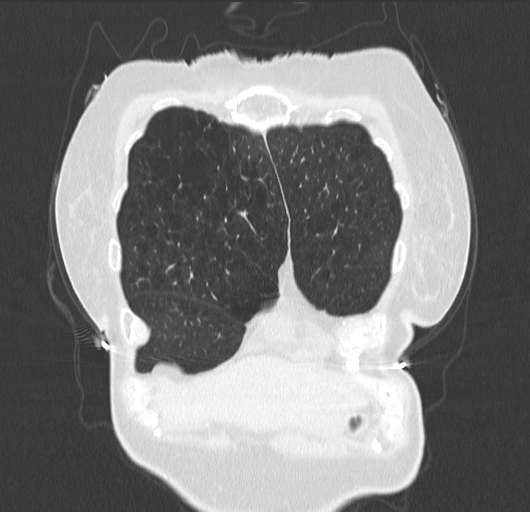
[im 49/122  lung]
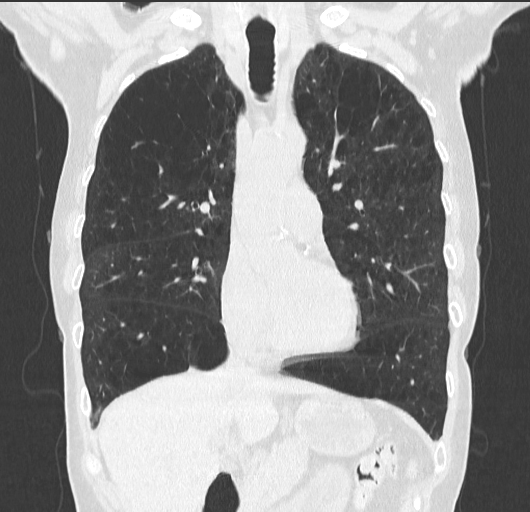
[im 73/122  lung]
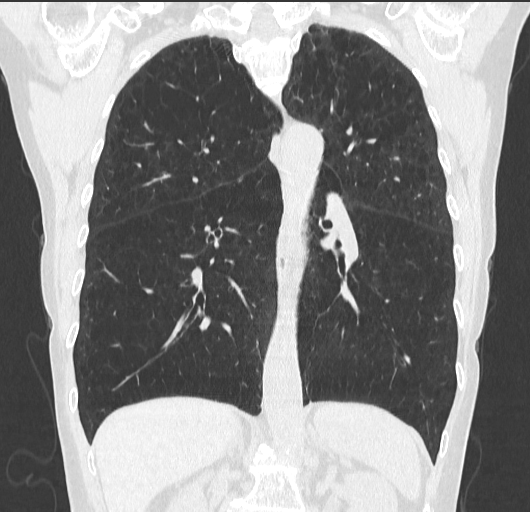

[14 of 36 positions shown; findings below may reference images not displayed]

Count of known CT and Cardiac Nuclear Medicine studies performed in the previous 
12 months = 0.
FINDINGS: LUNGS AND PLEURA:  Severe emphysematous change. 4 mm noncalcified nodule left 
lower lobe adjacent to the fissure. Scattered micronodules largest measuring 2 
mm. No acute consolidation or effusion. 
MEDIASTINUM:  No adenopathy. Normal heart size. No pericardial effusion. Mild 
coronary artery calcifications noted. 
CHEST WALL/AXILLA: No mass or adenopathy.  
UPPER ABDOMEN: Small hiatal hernia. Fatty liver. Status post cholecystectomy. 
MUSCULOSKELETAL: No acute abnormality. Degenerative change. Mild wedging T8 
vertebral body.
IMPRESSION: 4 mm noncalcified partially pleural-based nodule left lower lobe along the 
fissure. Please refer to the [HOSPITAL] recommendations for follow-up 
below.  
Incidental single solid nodule <6 mm: 
Low risk: No routine follow-up.  
High risk: Optional CT at 12 months. Certain patients at high risk with 
suspicious nodule morphology, upper lobe location, or both [DATE]-month 
follow-up.  
NOTE: These recommendations do not apply to patients with immunosuppression or 
patients with known primary cancer.  
REFERENCE: [HOSPITAL] 5650 Guidelines for Management of Incidentally 
Detected Pulmonary Nodules in Adults. Radiology 5650. 
Scattered noncalcified micronodules largest measuring 2 mm. 
No acute consolidation or effusion. 
In patients between the ages of 50-77 where pulmonary emphysema is noted on CT, 
recommend evaluation for low dose lung cancer screening protocol if patient is 
not already enrolled; as pulmonary emphysema is an independent risk factor for 
lung cancer. 
RADIATION DOSE REDUCTION: All CT scans are performed using radiation dose 
reduction techniques, when applicable.  Technical factors are evaluated and 
adjusted to ensure appropriate moderation of exposure.  Automated dose 
management technology is applied to adjust the radiation doses to minimize 
exposure while achieving diagnostic quality images.
